# Patient Record
Sex: Male | Born: 1965 | Race: Black or African American | Hispanic: No | Smoking: Never smoker
Health system: Southern US, Community
[De-identification: ages and names within clinical notes are randomized; demographics above are authoritative.]

---

## 2011-01-17 ENCOUNTER — Inpatient Hospital Stay (HOSPITAL_COMMUNITY)
Admission: EM | Admit: 2011-01-17 | Discharge: 2011-01-18 | DRG: 142 | Disposition: A | Discharge status: Home / Self Care | Payer: BC Managed Care – PPO | Attending: Internal Medicine | Admitting: Internal Medicine

## 2011-01-17 ENCOUNTER — Emergency Department (HOSPITAL_COMMUNITY): Payer: BC Managed Care – PPO

## 2011-01-17 ENCOUNTER — Encounter: Payer: Self-pay | Admitting: Internal Medicine

## 2011-01-17 DIAGNOSIS — R55 Syncope and collapse: Principal | ICD-10-CM | POA: Diagnosis present

## 2011-01-17 DIAGNOSIS — R12 Heartburn: Secondary | ICD-10-CM | POA: Diagnosis present

## 2011-01-17 LAB — COMPREHENSIVE METABOLIC PANEL
Albumin: 3.8 g/dL (ref 3.5–5.2)
BUN: 14 mg/dL (ref 6–23)
CO2: 30 mEq/L (ref 19–32)
Calcium: 9.4 mg/dL (ref 8.4–10.5)
Chloride: 106 mEq/L (ref 96–112)
Creatinine, Ser: 1 mg/dL (ref 0.50–1.35)
GFR calc non Af Amer: >60 mL/min (ref >60)
Total Bilirubin: 0.5 mg/dL (ref 0.3–1.2)

## 2011-01-17 LAB — URINALYSIS, ROUTINE W REFLEX MICROSCOPIC
Leukocytes, UA: NEGATIVE
Protein, ur: NEGATIVE mg/dL
Specific Gravity, Urine: 1.022 (ref 1.005–1.030)
Urobilinogen, UA: 0.2 mg/dL (ref 0.0–1.0)

## 2011-01-17 LAB — HIV ANTIBODY (ROUTINE TESTING W REFLEX): HIV: NONREACTIVE

## 2011-01-17 LAB — CBC
MCH: 26.8 pg (ref 26.0–34.0)
MCHC: 34.7 g/dL (ref 30.0–36.0)
MCV: 77.3 fL — ABNORMAL LOW (ref 78.0–100.0)
Platelets: 198 10*3/uL (ref 150–400)
RBC: 5.33 MIL/uL (ref 4.22–5.81)

## 2011-01-17 LAB — POCT I-STAT TROPONIN I: Troponin i, poc: 0 ng/mL (ref 0.00–0.08)

## 2011-01-17 LAB — DIFFERENTIAL
Basophils Relative: 0 % (ref 0–1)
Eosinophils Absolute: 0.2 10*3/uL (ref 0.0–0.7)
Eosinophils Relative: 4 % (ref 0–5)
Lymphs Abs: 1.6 10*3/uL (ref 0.7–4.0)
Monocytes Absolute: 0.5 10*3/uL (ref 0.1–1.0)
Monocytes Relative: 9 % (ref 3–12)
Neutrophils Relative %: 54 % (ref 43–77)

## 2011-01-17 LAB — RAPID URINE DRUG SCREEN, HOSP PERFORMED
Benzodiazepines: NOT DETECTED
Cocaine: NOT DETECTED
Opiates: NOT DETECTED

## 2011-01-17 LAB — CK TOTAL AND CKMB (NOT AT ARMC): Relative Index: 2 (ref 0.0–2.5)

## 2011-01-17 LAB — D-DIMER, QUANTITATIVE: D-Dimer, Quant: 0.51 ug/mL-FEU — ABNORMAL HIGH (ref 0.00–0.48)

## 2011-01-17 LAB — TROPONIN I: Troponin I: <0.3 ng/mL (ref <0.30)

## 2011-01-17 MED ORDER — IOHEXOL 300 MG/ML  SOLN
80.0000 mL | Freq: Once | INTRAMUSCULAR | Status: AC | PRN
  Administered 2011-01-17: 80 mL via INTRAVENOUS

## 2011-01-17 NOTE — H&P (Addendum)
Hospital Admission Note Date: 01/17/2011  Patient name: Dustin Gutierrez Medical record number: [id number] Date of birth: [birth year] Age: 45 y.o. Gender: male PCP: No primary provider on file.  Medical Service: Internal Medicine Teaching Service, B2  Attending physician:   Micheline Chapman  Pager: [phone] Resident (R2/R3):  Bethel Born   Pager: [phone] Resident (R1):  Janalyn Harder    Pager: [phone]  Chief Complaint: Syncope  History of Present Illness: The patient is a 45 yo man with a history of 2 prior syncopal episodes, presenting with syncope.  The patient awoke at 5 am, used the bathroom, started feeling "woozy", then experienced loss of consciousness, and believes he may have hit his head on the bathroom wall.  He regained consciousness within seconds, and was helped to his feet by his girlfriend.  He then experienced 2 further syncopal episodes within the next 5 minutes, both of which occurred while standing, and both times the patient was caught by his girlfriend and helped back to his feet.  He notes no dizziness, confusion following the LOC, loss of bowel of bladder function, tongue biting, palpitations, chest pain, SOB, dyspnea, focal weakness or numbness, and notes no new medications.  His girlfriend did not report any "shaking" associated with these episodes.  The patient does note decreased sleep lately due to working long hours, increased stress, and no PO intake since lunch on the previous day.  Meds: Tums OTC - for occasional heartburn  Allergies: Penicillin -> hives  PMH Episode of Bell's Palsy in 2008 Occasional heartburn 2 prior episodes of syncope, 1 occurring after his father died in 67, 1 occurring after aunt died in 69  Surg Hx None  FH Father - diabetes Mother - alzheimers Multiple siblings - diabetes Paternal uncle - diabetes No family history of heart disease or sudden death  SH Denies tobacco use Drinks <1 alcoholic drink/week Denies  illicits  Review of Systems: General: no fevers, chills, changes in weight, changes in appetite Skin: no rash HEENT: no blurry vision, hearing changes, sore throat Pulm: no dyspnea, coughing, wheezing CV: no chest pain, palpitations, shortness of breath Abd: no abdominal pain, nausea/vomiting, diarrhea/constipation GU: no dysuria, hematuria, polyuria Ext: no arthralgias, myalgias Neuro: no weakness, numbness, or tingling  Physical Exam: Temp: 98.6, BP lying: 130/63, HR: 62, BP standing: 123/71, HR 71, RR: 17, O2 sat: 97% RA General: alert, cooperative, and in no apparent distress, conversing freely HEENT: pupils equal round and reactive to light, vision grossly intact, oropharynx clear and non-erythematous  Neck: supple, no lymphadenopathy, JVD, or carotid bruits.  Taking a deep breath and holding it dropped the patient's HR from 60 to 39. Lungs: clear to ascultation bilaterally, normal work of respiration, no wheezes, rales, ronchi Heart: regular rate and rhythm, no murmurs, gallops, or rubs Abdomen: soft, non-tender, non-distended, normal bowel sounds Msk: no joint edema, warmth, or erythema Extremities: no cyanosis, clubbing, or edema Neurologic: alert & oriented X3, cranial nerves II-XII intact, strength 5/5 throughout, sensation intact to light touch  Lab results: CMET     Component Value Date/Time   NA 143 01/17/2011 0710   K 4.3 01/17/2011 0710   CL 106 01/17/2011 0710   CO2 30 01/17/2011 0710   GLUCOSE 107* 01/17/2011 0710   BUN 14 01/17/2011 0710   CREATININE 1.00 01/17/2011 0710   CALCIUM 9.4 01/17/2011 0710   PROT 7.7 01/17/2011 0710   ALBUMIN 3.8 01/17/2011 0710   AST 21 01/17/2011 0710   ALT 17 01/17/2011 0710  ALKPHOS 44 01/17/2011 0710   BILITOT 0.5 01/17/2011 0710   GFRNONAA >60 01/17/2011 0710   GFRAA >60 01/17/2011 0710    CBC    Component Value Date/Time   WBC 5.0 01/17/2011 0710   RBC 5.33 01/17/2011 0710   HGB 14.3 01/17/2011 0710   HCT 41.2 01/17/2011 0710    PLT 198 01/17/2011 0710   MCV 77.3* 01/17/2011 0710   MCH 26.8 01/17/2011 0710   MCHC 34.7 01/17/2011 0710   RDW 13.6 01/17/2011 0710   LYMPHSABS 1.6 01/17/2011 0710   MONOABS 0.5 01/17/2011 0710   EOSABS 0.2 01/17/2011 0710   BASOSABS 0.0 01/17/2011 0710    Cardiac Enzymes: Troponin: 0.00  D-Dimer: Recent Labs  Basename 01/17/11 0710   DDIMER 0.51*   Urinalysis: Normal, negative for nitrates and LE   Imaging results:  Chest CT 01/17/11:  No evidence of PE  Other results: EKG: bradycardia, no ST segment changes  Assessment & Plan by Problem: The patient is a 45 yo man presenting with 3 successive episodes of syncope, found to be bradycardic with an exaggerated carotid sinus response on examination.  1. 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  No supporting evidence for arrhythmia vs seizure vs PE vs medication change vs metabolic disturbance vs CVA. -ordered labs: TSH, lipid panel, HIV, cardiac enzymes, urine drug screen, Hb A1C -ordered carotid dopplers, echocardiogram -will consult cardiology for possible holter monitoring -admit to telemetry  2. Positive D-dimer -chest CT negative for PE  3. Occasional heartburn - currently asymptomatic -will monitor, may start PPI if pt becomes symptomatic  4. Prophy -Lovenox   R2/3______________________________      R1________________________________  ATTENDING: I performed and/or observed a history and physical examination of the patient.  I discussed the case with the residents as noted and reviewed the residents' notes.  I agree with the findings and plan--please refer to the attending physician note for more details.  Signature________________________________  Printed Name_____________________________

## 2011-01-18 DIAGNOSIS — R55 Syncope and collapse: Secondary | ICD-10-CM

## 2011-01-18 LAB — BASIC METABOLIC PANEL
BUN: 12 mg/dL (ref 6–23)
CO2: 28 mEq/L (ref 19–32)
Chloride: 107 mEq/L (ref 96–112)
Creatinine, Ser: 1.07 mg/dL (ref 0.50–1.35)
GFR calc Af Amer: >60 mL/min (ref >60)
Potassium: 3.8 mEq/L (ref 3.5–5.1)

## 2011-01-18 LAB — DIFFERENTIAL
Eosinophils Absolute: 0.3 10*3/uL (ref 0.0–0.7)
Lymphocytes Relative: 53 % — ABNORMAL HIGH (ref 12–46)
Lymphs Abs: 2.1 10*3/uL (ref 0.7–4.0)
Neutro Abs: 1.3 10*3/uL — ABNORMAL LOW (ref 1.7–7.7)
Neutrophils Relative %: 33 % — ABNORMAL LOW (ref 43–77)

## 2011-01-18 LAB — LIPID PANEL
Cholesterol: 158 mg/dL (ref 0–200)
LDL Cholesterol: 74 mg/dL (ref 0–99)
Triglycerides: 309 mg/dL — ABNORMAL HIGH (ref <150)
VLDL: 62 mg/dL — ABNORMAL HIGH (ref 0–40)

## 2011-01-18 LAB — CBC
MCH: 26.1 pg (ref 26.0–34.0)
MCHC: 33.6 g/dL (ref 30.0–36.0)
RDW: 13.5 % (ref 11.5–15.5)

## 2011-02-01 NOTE — Consult Note (Signed)
Dustin Gutierrez NO.:  [id number]  MEDICAL RECORD NO.:  [id number]  LOCATION:  3701                         FACILITY:  [facility]  PHYSICIAN:  Duke Salvia, MD, FACCDATE OF BIRTH:  09/07/1965  DATE OF CONSULTATION:  01/18/2011 DATE OF DISCHARGE:  01/18/2011                                CONSULTATION   Thank you very much for asking [place] to see [name] in consultation because of recurrent syncope.  The patient is a 45 year old married gentleman who is a father of 3 who has a history of recurrent syncope.  His most recent episode prompted this admission.  After having had a late breakfast on Saturday and not having eaten subsequent to that, he was in bed.  He got up at 5 o'clock to urinate.  He tripped on a pillow.  He went in emptied his bladder and then his wife heard him fall.  Their bathroom has an isolated toilet stall thankfully, the door opens out.  She went and found him in the bathroom standing up a little bit dizzed.  As she helped him move across the bathroom floor, he lost postural tone second time.  She thereafter helps him to stand and while he was washing his hands, he had lost consciousness again, and fell to the floor.  EMS was called upon arrival, vital signs were normal.  He recounts a sleepy sensation that preceded his syncope.  This is stereotypical in reminiscent of prior episodes.  He also had some post episode nausea in addition to the residual orthostatic intolerance.  His 2 prior episodes of syncope also occurred post micturition, one occurred and they are temporally related to his father having died, and the other his aunt having died.  He has some baseline orthostatic intolerance, but does not have shower intolerance or Jacuzzi use.  He typically stays relatively well hydrated.  He denies significant alcohol use or he denies any illicit drug use.  His past medical history in addition to above is notable for  Bell's palsy.  He has some history of heartburn.  PAST SURGICAL HISTORY:  Negative.  FAMILY HISTORY:  Negative.  REVIEW OF SYSTEMS:  Apart from what is outlined previously is broadly negative.  PHYSICAL EXAMINATION:  VITAL SIGNS:  His blood pressure is 116/67, his pulse was 64.  Orthostatic vital signs were not done. HEENT:  Normal.  Neck veins were flat.  Carotids were brisk and full bilaterally without bruits.  The back was without kyphosis, scoliosis. LUNGS:  Clear. HEART:  Heart sounds were regular without murmurs or gallops. ABDOMEN:  Soft.  Femoral pulses were not examined.  Distal pulses were intact.  There is no clubbing, cyanosis, or edema.  NEUROLOGICAL: Grossly normal. SKIN:  Warm and dry.  Laboratories were notable for normal metabolic profile.  His triglyceride level was quite high, has a fasting lab today of 309, HDL was low at 22.  CBC had a mildly low hemoglobin with a mildly low MCV. TSH was normal. Hemoglobin A1c is borderline abnormal.  IMPRESSION: 1. Neurally mediated syncope. 2. Echocardiogram was done this morning, but is not yet read.  Electrocardiogram dated 0500 this morning demonstrated  sinus rhythm at 63 with interval 1.5/0.09/0.41.  There was mild ST-segment elevation rather diffusely consistent with early repolarization.  IMPRESSION: 1. Recurrent syncope - neurally-mediated having post micturition. 2. ECG with early repolarization. 3. Borderline anemia. 4. Elevated hemoglobin A1c. 5. Dyslipidemia.  From the electrophysiologic point-of-view, [name], almost only has neurally-mediated syncope where we spent a long-time discussing the physiology of this process and the importance of maintaining hydration. We also discussed the importance of recognizing triggers, and trigger avoidance, and I raised the possibility of urinating sitting down.  He will also need followup for his lipid issues as well as further evaluation of his anemia.  Thank  you for this consultation.     Duke Salvia, MD, [facility]     SCK/MEDQ  D:  01/18/2011  T:  01/18/2011  Job:  [id number]  Electronically Signed by Sherryl Manges MD [facility] on 02/01/2011 01:53:06 PM

## 2011-02-03 NOTE — Discharge Summary (Signed)
NAMECODY, ALBUS NO.:  [id number]  MEDICAL RECORD NO.:  [id number]  LOCATION:  3701                         FACILITY:  [facility]  PHYSICIAN:  Tilford Pillar, MD     DATE OF BIRTH:  May 12, 1966  DATE OF ADMISSION:  01/17/2011 DATE OF DISCHARGE:  01/18/2011                              DISCHARGE SUMMARY   DISCHARGE DIAGNOSES: 1. Syncope. 2. Heartburn.  DISCHARGE MEDICATIONS WITH ACCURATE DOSES:  Tums over-the-counter 3 tablets by mouth daily as needed.  DISPOSITION AND FOLLOWUP:  The patient was discharged in stable and improved condition from [facility] on January 18, 2011 with no further recurrence of lightheadedness or loss of consciousness.  The patient will follow up at the [facility] for further evaluation of this issue.  PROCEDURES PERFORMED: 1. CT chest:  There is no evidence of pulmonary embolism. 2. EKG:  Bradycardia.  No ST-segment changes. 3. Echocardiogram:  Ejection fraction 60-65%, systolic function     normal, and wall motion normal.  CONSULTATIONS:  Cardiology.  ADMITTING HISTORY AND PHYSICAL:  The patient is a 45 year old man with a history of two prior syncopal episodes presenting with syncope.  The patient awoke at 5:00 a.m., used the bathroom, started feeling woozy, then experienced loss of consciousness and believes he fell down.  He regained consciousness within seconds and was helped to his feet by his girlfriend.  He then experienced two further syncopal episodes within the next 5 minutes, both of which occurred while standing and both times the patient was caught by his girlfriend and helped back to his feet. He notes no dizziness or confusion following loss of consciousness, no loss of bowel or bladder function, tongue biting, palpitations, chest pain, shortness of breath, dyspnea, focal weakness or numbness, and notes no new medication.  His girlfriend did not report any shaking associated with  these episodes.  The patient does note decreased sleep lately due to working long hours, increased stress, and no p.o. intake since lunch on the previous day.  The patient does note a history of two prior episodes of syncope, one occurring days after his father died in 48 and one occurring days after his aunt died in 11/10/99.  PHYSICAL EXAMINATION:  VITAL SIGNS:  Temperature 98.6, blood pressure supine 130/63, heart rate 62, blood pressure standing 123/71, heart rate 71, respirations 17, and oxygen saturation 97% on room air. GENERAL:  Alert, cooperative, and in no apparent distress, resting freely. HEENT:  Pupils equal, round, and reactive to light.  Vision grossly intact.  Oropharynx clear and nonerythematous. NECK:  Supple.  No lymphadenopathy, no JVD, and no carotid bruits.  Deep carotid massage dropped the patient's heart rate from 60-39. LUNGS:  Clear to auscultation bilaterally.  Normal work of respiration. No wheezes, rales, or rhonchi. HEART:  Regular rate and rhythm.  No murmurs, gallops, or rubs. ABDOMEN:  Soft, nontender, and nondistended.  Normal bowel sounds. MUSCULOSKELETAL:  No cyanosis, clubbing, or edema. NEUROLOGIC:  Alert and oriented x3.  Cranial nerves II-XII intact. Strength 5/5 throughout.  Sensation intact to light touch.  ADMISSION LABORATORY DATA:  Sodium 143, potassium 4.3, chloride 106, bicarb 30, BUN 14, creatinine  1, and glucose 107.  LFTs normal.  WBC 5, hemoglobin 14.3, hematocrit 41.2, and platelets 198.  D-dimer is 0.51. Cardiac enzymes negative.  Urinalysis normal, negative for nitrates and leukocyte esterase.  HOSPITAL COURSE BY PROBLEM: 1. Syncope.  The patient presented with 3 successive episodes of     syncope within 5 minutes.  These episodes were likely vasovagal and     likely triggered by significant life stressors that the patient     reports recently and possibly contributed to by low p.o. intake     over the last 24 hours.  The patient  reports two prior syncopal,     episodes which were also likely vasovagal following stressful life     events involving the death of family members.  Cardiology was     consulted for the possible diagnosis of carotid sinus     hypersensitivity but did not believe that this was contributing to the     patient's symptoms.  The patient had no recurrent episodes of     syncope during hospitalization and was discharged in stable     condition.  Cardiac enzymes, echocardiogram, carotid Dopplers,     urine drug screen, hemoglobin A1c, HIV, TSH, and lipid panel were     all investigated and were all found to be normal/unremarkable. 2. Positive D-dimer.  The patient was found to have minorly positive D-     dimer on admission and the followup chest CT showed no evidence of     pulmonary embolism.  DISCHARGE LABORATORY DATA:  WBC 4.0, hemoglobin 12.6, hematocrit 37.5, and platelets 196.  Sodium 142, potassium 3.8, chloride 107, bicarb 28, BUN 12, creatinine 1.07, and glucose 113.  Hemoglobin A1c 6.2, LDL 74, and HDL 22.  TSH 2.435.  DISCHARGE VITAL SIGNS:  Temperature 97.7, blood pressure 116/67, pulse 64, respirations 20, and oxygen saturation 97% on room air.    ______________________________ Janalyn Harder, MD   ______________________________ Tilford Pillar, MD    RB/MEDQ  D:  01/19/2011  T:  01/20/2011  Job:  [id number]  Electronically Signed by Janalyn Harder MD on 01/28/2011 06:16:39 PM Electronically Signed by Tilford Pillar  on 02/03/2011 03:27:16 PM

## 2011-02-16 ENCOUNTER — Encounter: Payer: BC Managed Care – PPO | Admitting: Internal Medicine

## 2012-02-14 IMAGING — CT CT ANGIO CHEST
2 of 7 series · 19 of 36 positions shown · IV contrast (APPLIED)
Comparison: None.

CLINICAL DATA: Repeated syncopal episodes, evaluate for PE

CT ANGIOGRAPHY CHEST WITH CONTRAST
TECHNIQUE: Multidetector CT imaging of the chest was performed
using the standard protocol during bolus administration of
intravenous contrast.  Multiplanar CT image reconstructions
including MIPs were obtained to evaluate the vascular anatomy.
Contrast:  80 ml Pmnipaque-IVV IV

[Series 12: pulm embolism 2.0 spo thins · coronal · 0.74mm/px · 1 of 112 slices shown]
[im 56/112  mediastinal]
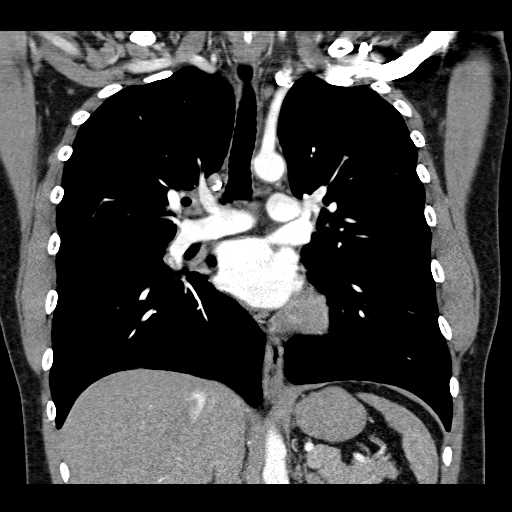

[Series 17: pulm embolism 1.0 b25f thins · axial · 0.73mm/px · z∈[-343,-38]mm · 18 of 339 slices shown]
[im 17/339  lung]
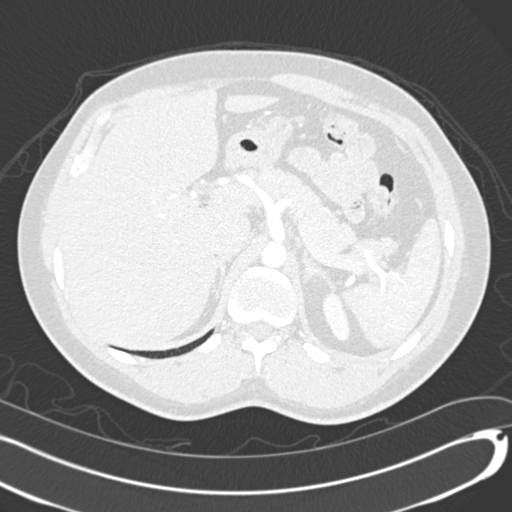
[im 34/339  mediastinal]
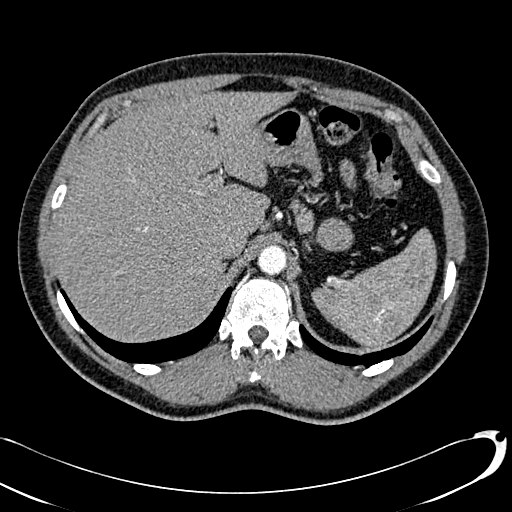
[im 51/339  lung]
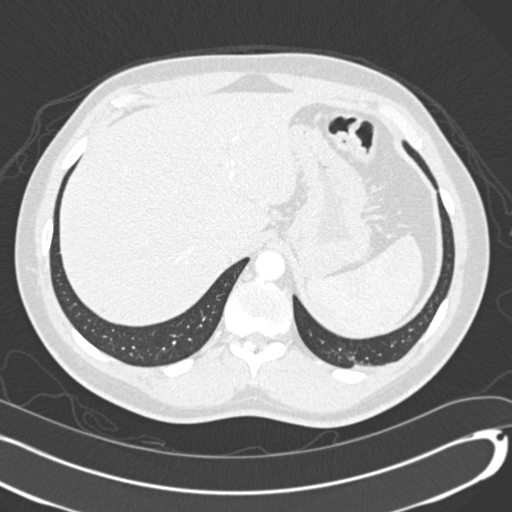
[im 68/339  mediastinal]
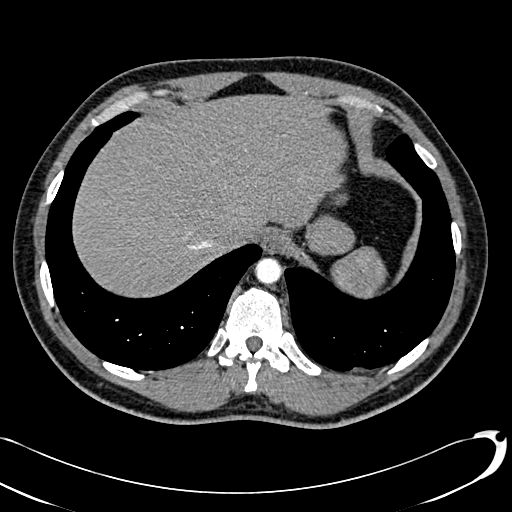
[im 85/339  lung]
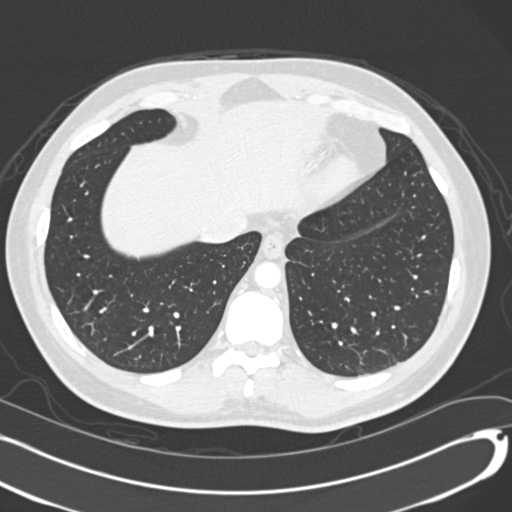
[im 102/339  mediastinal]
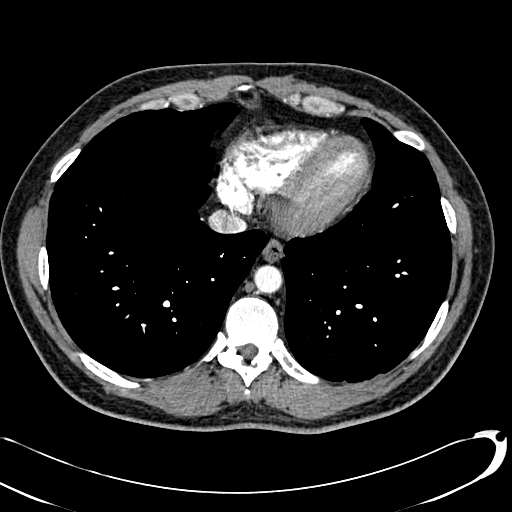
[im 119/339  lung]
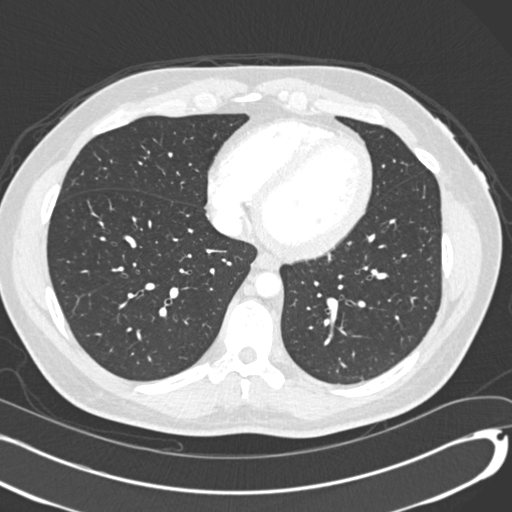
[im 136/339  mediastinal]
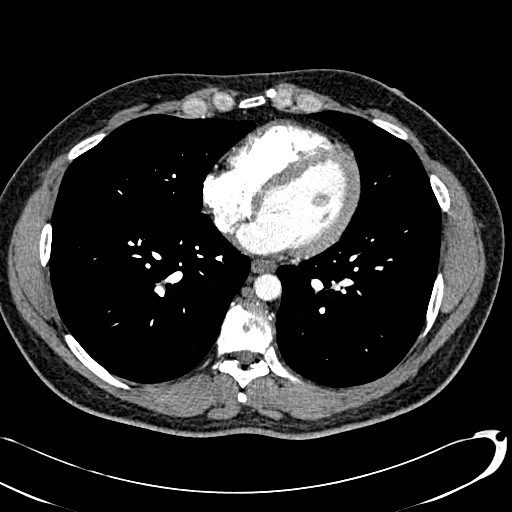
[im 153/339  lung]
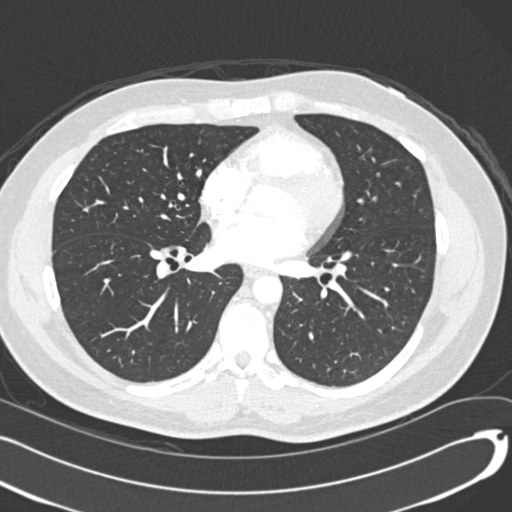
[im 186/339  mediastinal]
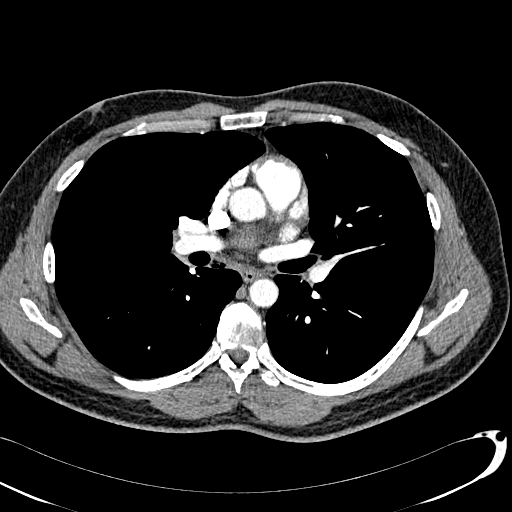
[im 203/339  lung]
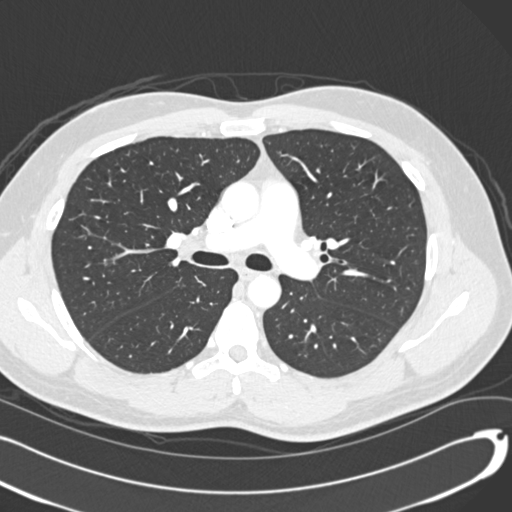
[im 220/339  mediastinal]
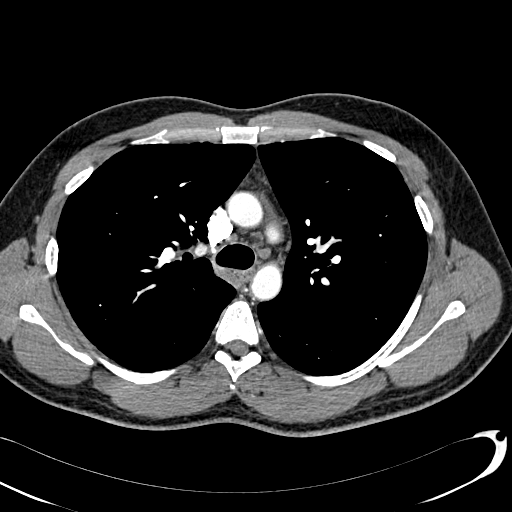
[im 237/339  lung]
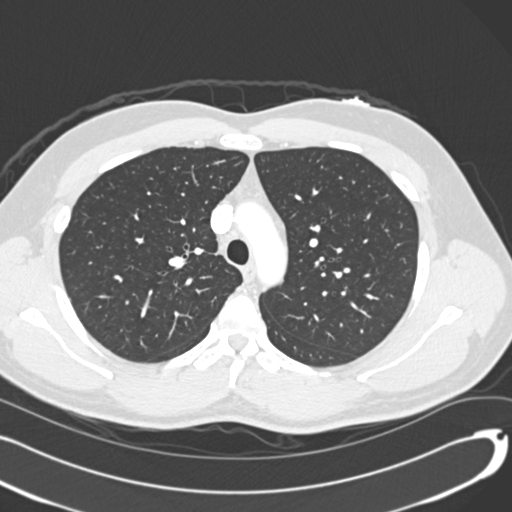
[im 254/339  mediastinal]
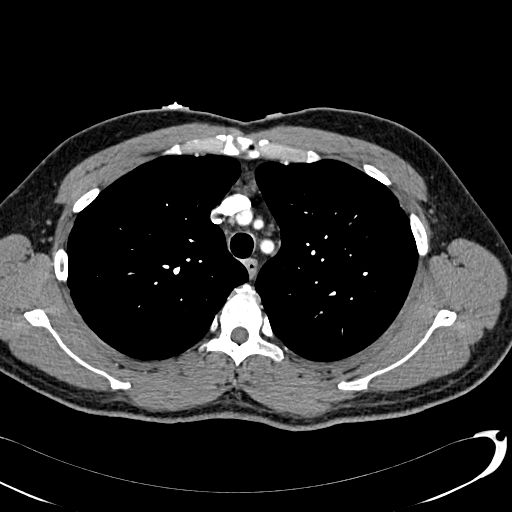
[im 271/339  lung]
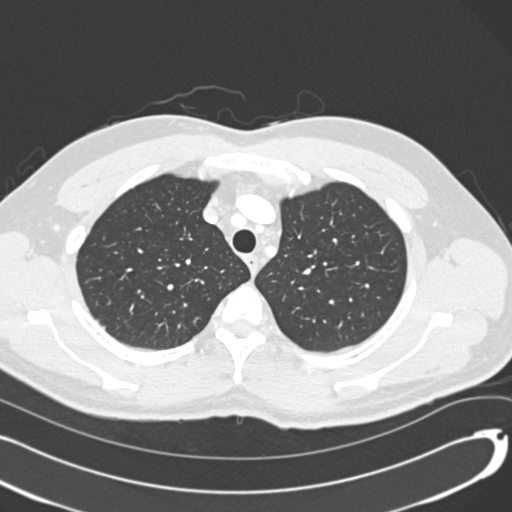
[im 288/339  mediastinal]
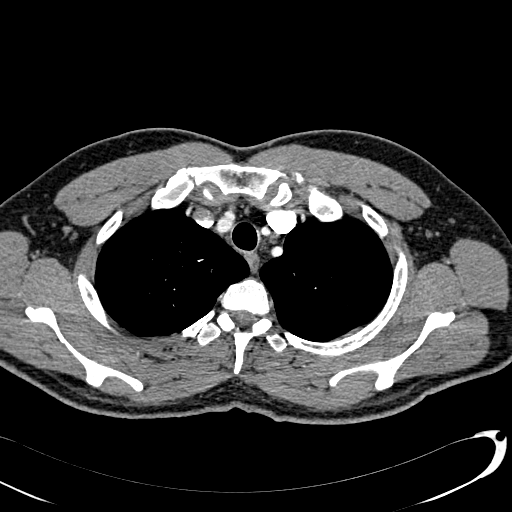
[im 305/339  lung]
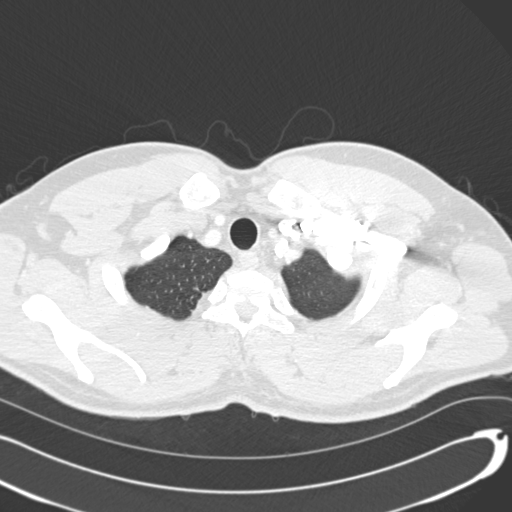
[im 322/339  mediastinal]
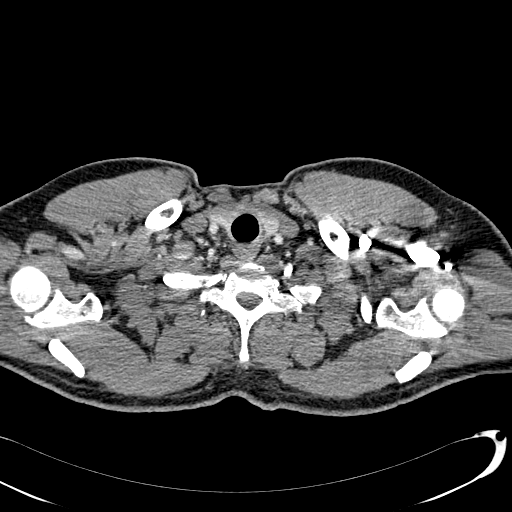

[19 of 36 positions shown; findings below may reference images not displayed]

FINDINGS: No evidence of pulmonary embolism.

Minimal patchy opacity at the left lung base, likely
atelectasis/scarring.  Lungs otherwise clear.  No suspicious
pulmonary nodules. No pleural effusion or pneumothorax.

The visualized thyroid is unremarkable.

The heart is normal in size.  No pericardial effusion.

No suspicious mediastinal, hilar, or axillary lymphadenopathy.

Visualized upper abdomen is within normal limits.

Visualized osseous structures are within normal limits.

Review of the MIP images confirms the above findings.
IMPRESSION: No evidence of pulmonary embolism.

## 2015-05-02 NOTE — Care Management (Signed)
Contacted by [facility] Barn[facility].  Requesting information about anticoagulation status.  Provided requested information.  Discussed that Terrilee CroakBrenda Holland had spoken with his nurse on 11/23 and faxed order for home health, face to face and discharge summary 11/23.

## 2018-02-20 ENCOUNTER — Other Ambulatory Visit: Payer: Self-pay

## 2018-02-20 ENCOUNTER — Emergency Department (HOSPITAL_BASED_OUTPATIENT_CLINIC_OR_DEPARTMENT_OTHER)
Admission: EM | Admit: 2018-02-20 | Discharge: 2018-02-20 | Disposition: A | Discharge status: Home / Self Care | Payer: BLUE CROSS/BLUE SHIELD | Attending: Emergency Medicine | Admitting: Emergency Medicine

## 2018-02-20 ENCOUNTER — Emergency Department (HOSPITAL_BASED_OUTPATIENT_CLINIC_OR_DEPARTMENT_OTHER): Payer: BLUE CROSS/BLUE SHIELD

## 2018-02-20 ENCOUNTER — Encounter (HOSPITAL_BASED_OUTPATIENT_CLINIC_OR_DEPARTMENT_OTHER): Payer: Self-pay

## 2018-02-20 DIAGNOSIS — R2243 Localized swelling, mass and lump, lower limb, bilateral: Secondary | ICD-10-CM | POA: Diagnosis present

## 2018-02-20 DIAGNOSIS — R609 Edema, unspecified: Secondary | ICD-10-CM | POA: Insufficient documentation

## 2018-02-20 LAB — COMPREHENSIVE METABOLIC PANEL
ALT: 16 U/L (ref 0–44)
AST: 22 U/L (ref 15–41)
Albumin: 4.3 g/dL (ref 3.5–5.0)
Alkaline Phosphatase: 38 U/L (ref 38–126)
Anion gap: 7 (ref 5–15)
BUN: 21 mg/dL — ABNORMAL HIGH (ref 6–20)
CO2: 28 mmol/L (ref 22–32)
Calcium: 9 mg/dL (ref 8.9–10.3)
Chloride: 107 mmol/L (ref 98–111)
Creatinine, Ser: 0.97 mg/dL (ref 0.61–1.24)
GFR calc Af Amer: >60 mL/min (ref >60)
GFR calc non Af Amer: >60 mL/min (ref >60)
Glucose, Bld: 95 mg/dL (ref 70–99)
Potassium: 3.8 mmol/L (ref 3.5–5.1)
Sodium: 142 mmol/L (ref 135–145)
Total Bilirubin: 0.5 mg/dL (ref 0.3–1.2)
Total Protein: 7.7 g/dL (ref 6.5–8.1)

## 2018-02-20 NOTE — ED Notes (Signed)
ED Provider at bedside. 

## 2018-02-20 NOTE — Discharge Instructions (Addendum)
Elevate your legs whenever you are not walking on them.  Begin wearing compression stockings while you are at work.  Please return the emergency department if you develop any new or worsening symptoms.  Please follow-up and establish care with a primary care provider for management of your general health and further management of your leg swelling is continuing.

## 2018-02-20 NOTE — ED Triage Notes (Signed)
C/o swelling to right LE-sent from [facility] for r/o DVT-NAD-steady gait

## 2018-02-20 NOTE — ED Provider Notes (Signed)
[facility] EMERGENCY DEPARTMENT Provider Note   CSN: [id number] Arrival date & time: 02/20/18  1858     History   Chief Complaint Chief Complaint  Patient presents with  . Leg Swelling    HPI Dustin Gutierrez is a 52 y.o. male who is previously healthy who presents with a one-week history of lower extremity edema.  He was seen at urgent care prior to arrival and sent to the emergency department for further evaluation and rule out of DVT.  Patient recently started a job in the past few weeks no longer walking regularly and standing in one position.  He denies any pain to his calves or lower extremities.  He denies any redness.  He denies any chest pain, shortness of breath.  He has no history of DVT, denies recent long trips, surgeries, known cancer.  HPI  History reviewed. No pertinent past medical history.  There are no active problems to display for this patient.   History reviewed. No pertinent surgical history.      Home Medications    Prior to Admission medications   Not on File    Family History No family history on file.  Social History Social History   Tobacco Use  . Smoking status: Never Smoker  . Smokeless tobacco: Never Used  Substance Use Topics  . Alcohol use: Yes    Comment: occ  . Drug use: Never     Allergies   Penicillins   Review of Systems Review of Systems  Constitutional: Negative for chills and fever.  HENT: Negative for facial swelling and sore throat.   Respiratory: Negative for shortness of breath.   Cardiovascular: Positive for leg swelling. Negative for chest pain.  Gastrointestinal: Negative for abdominal pain, nausea and vomiting.  Genitourinary: Negative for dysuria.  Musculoskeletal: Negative for back pain.  Skin: Negative for rash and wound.  Neurological: Negative for headaches.  Psychiatric/Behavioral: The patient is not nervous/anxious.      Physical Exam Updated Vital Signs BP 121/75 (BP Location:  Left Arm)   Pulse (!) 50   Temp 98 F (36.7 C) (Oral)   Resp 16   Ht 6' (1.829 m)   Wt 86.6 kg   SpO2 100%   BMI 25.90 kg/m   Physical Exam  Constitutional: He appears well-developed and well-nourished. No distress.  HENT:  Head: Normocephalic and atraumatic.  Mouth/Throat: Oropharynx is clear and moist. No oropharyngeal exudate.  Eyes: Pupils are equal, round, and reactive to light. Conjunctivae are normal. Right eye exhibits no discharge. Left eye exhibits no discharge. No scleral icterus.  Neck: Normal range of motion. Neck supple. No thyromegaly present.  Cardiovascular: Normal rate, regular rhythm, normal heart sounds and intact distal pulses. Exam reveals no gallop and no friction rub.  No murmur heard. Pulmonary/Chest: Effort normal and breath sounds normal. No stridor. No respiratory distress. He has no wheezes. He has no rales.  Abdominal: Soft. Bowel sounds are normal. He exhibits no distension. There is no tenderness. There is no rebound and no guarding.  Musculoskeletal: He exhibits no edema.  1+ pitting edema bilaterally, right leg mildly larger than the left, no calf tenderness, no erythema noted  Lymphadenopathy:    He has no cervical adenopathy.  Neurological: He is alert. Coordination normal.  Skin: Skin is warm and dry. No rash noted. He is not diaphoretic. No pallor.  Psychiatric: He has a normal mood and affect.  Nursing note and vitals reviewed.    ED Treatments / Results  Labs (all labs ordered are listed, but only abnormal results are displayed) Labs Reviewed  COMPREHENSIVE METABOLIC PANEL - Abnormal; Notable for the following components:      Result Value   BUN 21 (*)    All other components within normal limits    EKG None  Radiology US Venous Img Lower Bilateral  Result Date: 02/20/2018 CLINICAL DATA:  Bilateral lower extremity swelling for 1-2 weeks. EXAM: BILATERAL LOWER EXTREMITY VENOUS DOPPLER ULTRASOUND TECHNIQUE: Gray-scale sonography  with graded compression, as well as color Doppler and duplex ultrasound were performed to evaluate the lower extremity deep venous systems from the level of the common femoral vein and including the common femoral, femoral, profunda femoral, popliteal and calf veins including the posterior tibial, peroneal and gastrocnemius veins when visible. The superficial great saphenous vein was also interrogated. Spectral Doppler was utilized to evaluate flow at rest and with distal augmentation maneuvers in the common femoral, femoral and popliteal veins. COMPARISON:  None. FINDINGS: RIGHT LOWER EXTREMITY Common Femoral Vein: No evidence of thrombus. Normal compressibility, respiratory phasicity and response to augmentation. Saphenofemoral Junction: No evidence of thrombus. Normal compressibility and flow on color Doppler imaging. Profunda Femoral Vein: No evidence of thrombus. Normal compressibility and flow on color Doppler imaging. Femoral Vein: No evidence of thrombus. Normal compressibility, respiratory phasicity and response to augmentation. Popliteal Vein: No evidence of thrombus. Normal compressibility, respiratory phasicity and response to augmentation. Calf Veins: No evidence of thrombus. Normal compressibility and flow on color Doppler imaging. Superficial Great Saphenous Vein: No evidence of thrombus. Normal compressibility. Venous Reflux:  None. Other Findings: Mild subcutaneous soft tissue edema about the ankle. LEFT LOWER EXTREMITY Common Femoral Vein: No evidence of thrombus. Normal compressibility, respiratory phasicity and response to augmentation. Saphenofemoral Junction: No evidence of thrombus. Normal compressibility and flow on color Doppler imaging. Profunda Femoral Vein: No evidence of thrombus. Normal compressibility and flow on color Doppler imaging. Femoral Vein: No evidence of thrombus. Normal compressibility, respiratory phasicity and response to augmentation. Popliteal Vein: No evidence of  thrombus. Normal compressibility, respiratory phasicity and response to augmentation. Calf Veins: No evidence of thrombus. Normal compressibility and flow on color Doppler imaging. Superficial Great Saphenous Vein: No evidence of thrombus. Normal compressibility. Venous Reflux:  None. Other Findings: Trace subcutaneous soft tissue edema about the left knee. IMPRESSION: No evidence of deep venous thrombosis. Trace subcutaneous soft tissue edema about the right ankle and left knee. Electronically Signed   By: Tollie Eth M.D.   On: 02/20/2018 22:04    Procedures Procedures (including critical care time)  Medications Ordered in ED Medications - No data to display   Initial Impression / Assessment and Plan / ED Course  I have reviewed the triage vital signs and the nursing notes.  Pertinent labs & imaging results that were available during my care of the patient were reviewed by me and considered in my medical decision making (see chart for details).     Patient with suspected dependent edema from his change in his job to standing most of the day.  CMP is unremarkable.  Bilateral venous ultrasound is negative for DVT.  Compression stockings recommended as well as elevation.  Decrease salt intake.  Patient advised to follow-up and establish care with a primary care provider for further management.  Return precautions discussed.  Patient understands and agrees with plan.  Patient vitals stable throughout ED course and discharged in satisfactory condition.  Final Clinical Impressions(s) / ED Diagnoses   Final diagnoses:  Peripheral edema  ED Discharge Orders    None       Verdis Prime 02/20/18 2334    Charlynne Pander, MD 02/21/18 [phone]

## 2020-08-07 IMAGING — US US EXTREM LOW VENOUS BILAT
1 series · 13 of 24 positions shown · non-contrast
Comparison: None.

CLINICAL DATA: Bilateral lower extremity swelling for 1-2 weeks.



[Series 1: us extrem low venous bilat · 0.08mm/px · 13 of 58 slices shown]
[im 1/58]
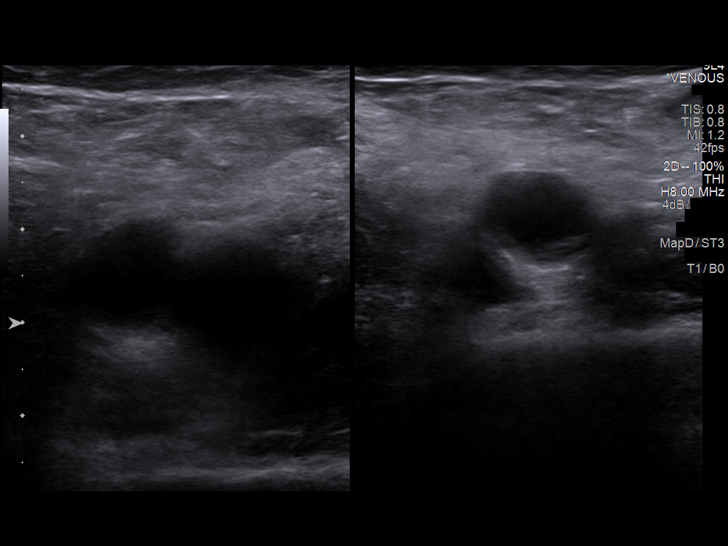
[im 5/58]
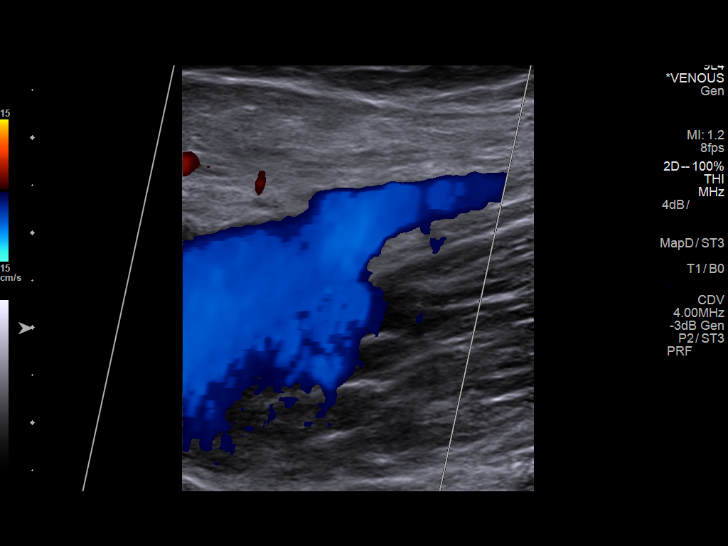
[im 10/58]
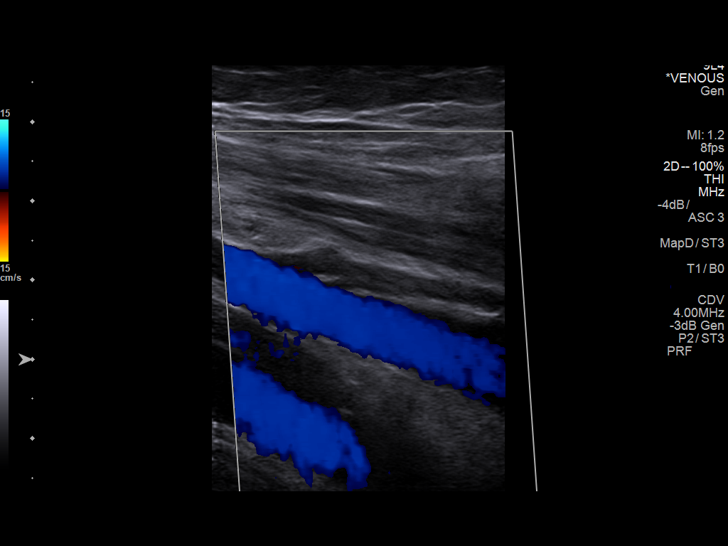
[im 15/58]
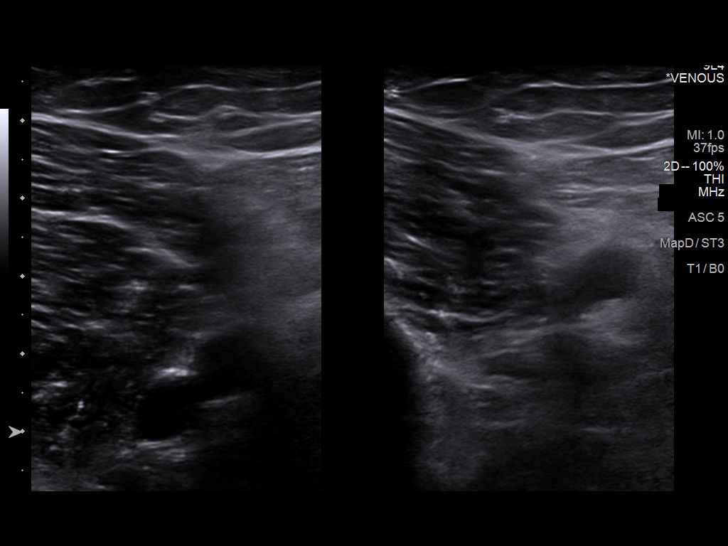
[im 20/58]
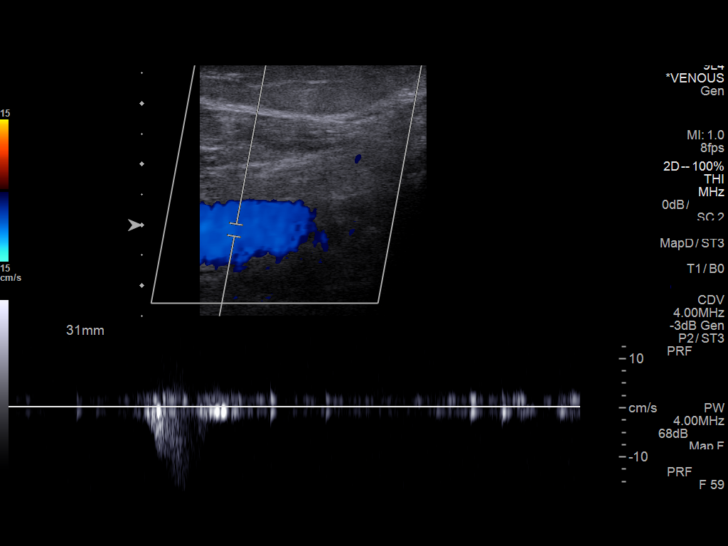
[im 25/58]
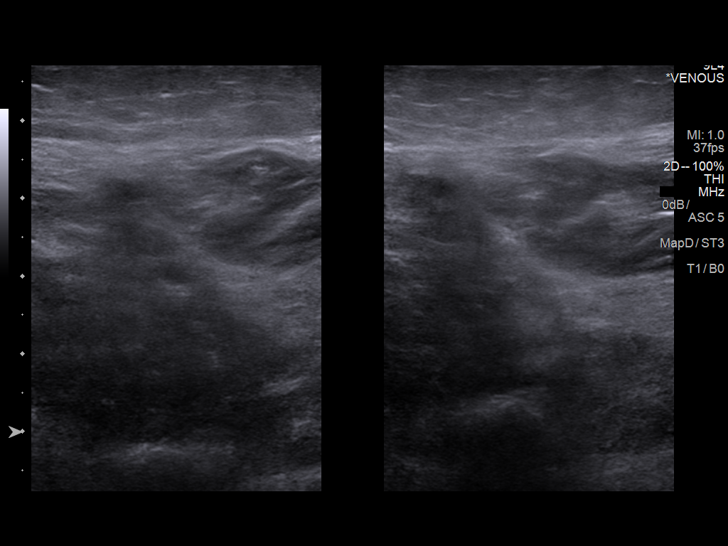
[im 30/58]
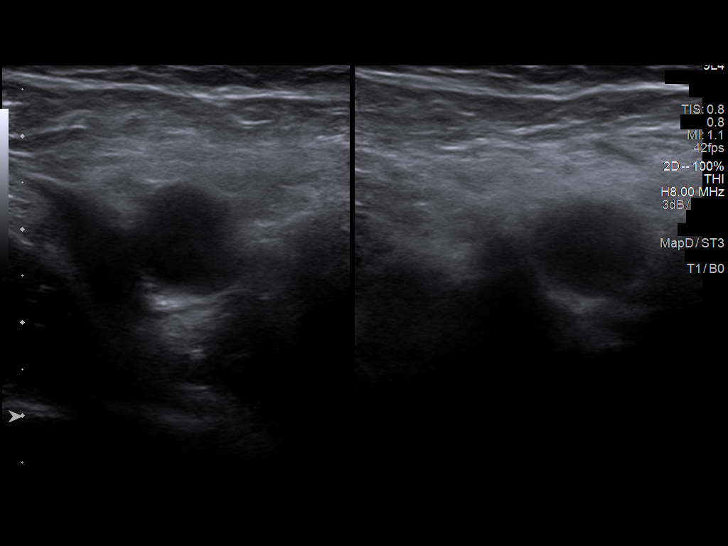
[im 33/58]
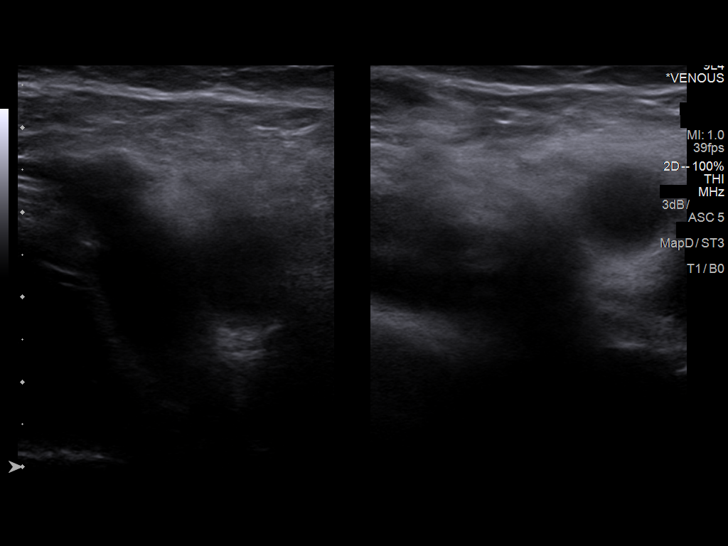
[im 38/58]
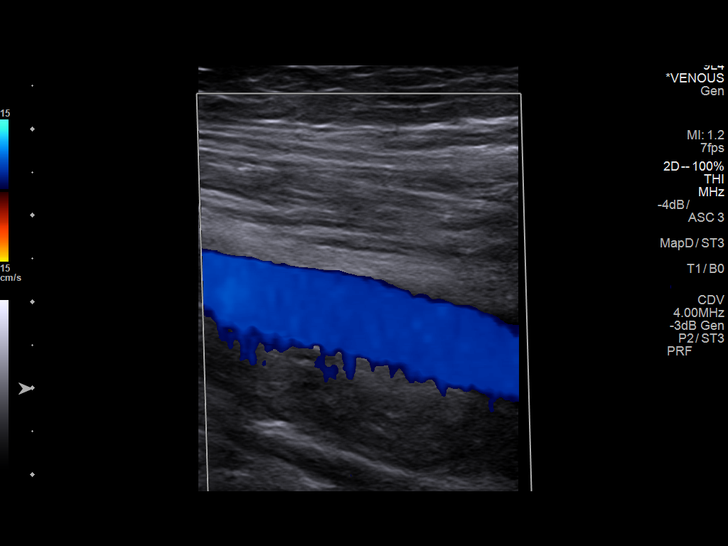
[im 43/58]
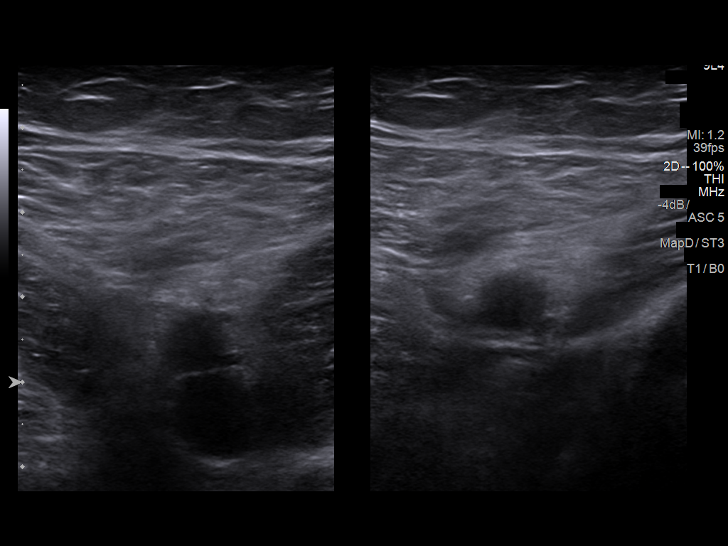
[im 48/58]
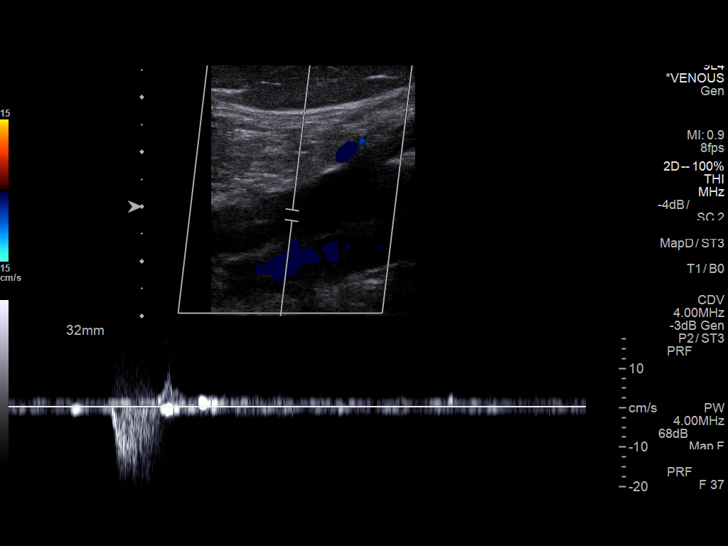
[im 53/58]
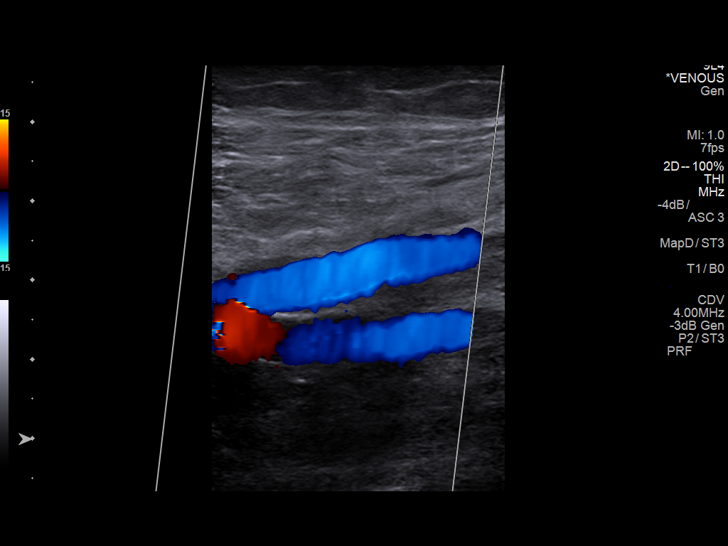
[im 58/58]
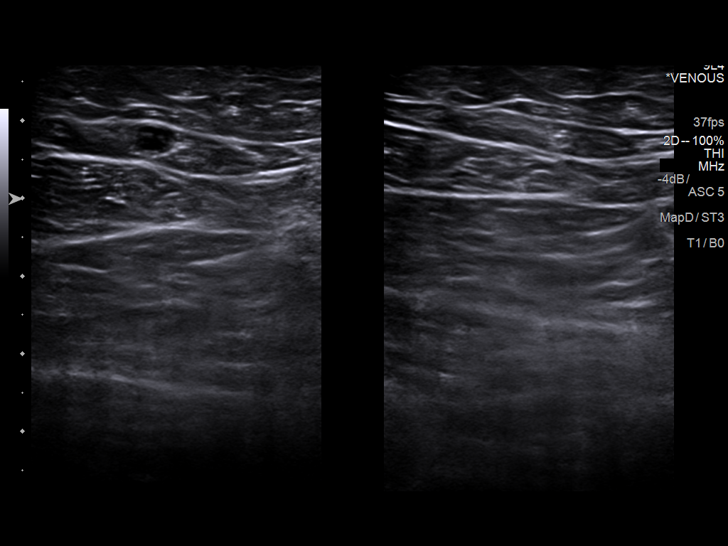

[13 of 24 positions shown; findings below may reference images not displayed]

FINDINGS: RIGHT LOWER EXTREMITY

Common Femoral Vein: No evidence of thrombus. Normal
compressibility, respiratory phasicity and response to augmentation.

Saphenofemoral Junction: No evidence of thrombus. Normal
compressibility and flow on color Doppler imaging.

Profunda Femoral Vein: No evidence of thrombus. Normal
compressibility and flow on color Doppler imaging.

Femoral Vein: No evidence of thrombus. Normal compressibility,
respiratory phasicity and response to augmentation.

Popliteal Vein: No evidence of thrombus. Normal compressibility,
respiratory phasicity and response to augmentation.

Calf Veins: No evidence of thrombus. Normal compressibility and flow
on color Doppler imaging.

Superficial Great Saphenous Vein: No evidence of thrombus. Normal
compressibility.

Venous Reflux:  None.

Other Findings: Mild subcutaneous soft tissue edema about the ankle.

LEFT LOWER EXTREMITY

Common Femoral Vein: No evidence of thrombus. Normal
compressibility, respiratory phasicity and response to augmentation.

Saphenofemoral Junction: No evidence of thrombus. Normal
compressibility and flow on color Doppler imaging.

Profunda Femoral Vein: No evidence of thrombus. Normal
compressibility and flow on color Doppler imaging.

Femoral Vein: No evidence of thrombus. Normal compressibility,
respiratory phasicity and response to augmentation.

Popliteal Vein: No evidence of thrombus. Normal compressibility,
respiratory phasicity and response to augmentation.

Calf Veins: No evidence of thrombus. Normal compressibility and flow
on color Doppler imaging.

Superficial Great Saphenous Vein: No evidence of thrombus. Normal
compressibility.

Venous Reflux:  None.

Other Findings: Trace subcutaneous soft tissue edema about the left
knee.
IMPRESSION: No evidence of deep venous thrombosis. Trace subcutaneous soft
tissue edema about the right ankle and left knee.
